# Patient Record
Sex: Male | Born: 1990 | Race: White | Hispanic: No | Marital: Single | Smoking: Never smoker
Health system: Southern US, Community
[De-identification: ages and names within clinical notes are randomized; demographics above are authoritative.]

## PROBLEM LIST (undated history)

## (undated) HISTORY — PX: INNER EAR SURGERY: SHX679

## (undated) HISTORY — PX: RECONSTRUCTION / CORRECTION OF NIPPLE / AEROLA: SUR1073

---
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
## 2012-08-20 ENCOUNTER — Encounter (INDEPENDENT_AMBULATORY_CARE_PROVIDER_SITE_OTHER): Payer: Self-pay | Admitting: General Surgery

## 2012-08-20 ENCOUNTER — Ambulatory Visit (INDEPENDENT_AMBULATORY_CARE_PROVIDER_SITE_OTHER): Payer: 59 | Admitting: General Surgery

## 2012-08-20 VITALS — BP 110/66 | HR 80 | Resp 16 | Ht 69.0 in | Wt 142.6 lb

## 2012-08-20 DIAGNOSIS — K439 Ventral hernia without obstruction or gangrene: Secondary | ICD-10-CM

## 2012-08-20 NOTE — Progress Notes (Signed)
Patient ID: Gary Knapp, male   DOB: [birth year], 22 y.o.   MRN: [id number]  Chief Complaint  Patient presents with  . New Evaluation    abd hernia    HPI Gary Knapp is a 22 y.o. male.  The patient is a 22 year old male who is self-referred for evaluation of the epigastric hernia. Patient states she was small has not gotten bigger is not in any pain. The patient states he can become more active and is looking potentially become a Systems analyst. He does not want this to interfere with his future goals.  HPI  History reviewed. No pertinent past medical history.  Past Surgical History  Procedure Laterality Date  . Reconstruction / correction of nipple / aerola    . Inner ear surgery      History reviewed. No pertinent family history.  Social History History  Substance Use Topics  . Smoking status: Never Smoker   . Smokeless tobacco: Never Used  . Alcohol Use: No    No Known Allergies  No current outpatient prescriptions on file.   No current facility-administered medications for this visit.    Review of Systems Review of Systems  Constitutional: Negative.   HENT: Negative.   Respiratory: Negative.   Cardiovascular: Negative.   Gastrointestinal: Negative.   Neurological: Negative.   All other systems reviewed and are negative.    Blood pressure 110/66, pulse 80, resp. rate 16, height 5\' 9"  (1.753 m), weight 142 lb 9.6 oz (64.683 kg).  Physical Exam Physical Exam  Constitutional: He is oriented to person, place, and time. He appears well-developed and well-nourished.  HENT:  Head: Normocephalic and atraumatic.  Eyes: Conjunctivae and EOM are normal. Pupils are equal, round, and reactive to light.  Neck: Normal range of motion. Neck supple.  Cardiovascular: Normal rate, regular rhythm and normal heart sounds.   Pulmonary/Chest: Effort normal and breath sounds normal.  Abdominal: Soft. Bowel sounds are normal. A hernia is present. Hernia confirmed  positive in the ventral area.    Musculoskeletal: Normal range of motion.  Neurological: He is alert and oriented to person, place, and time.    Data Reviewed none  Assessment    22 year old male with a likely pre-peritoneal epigastric ventral hernia.     Plan     1. We'll proceed to the operating room for open primary repairof a ventral hernia. 2. All risks and benefits were discussed with the patient, to generally include infection, bleeding, damage to surrounding structures, acute and chronic nerve pain, and recurrence. Alternatives were offered and described.  All questions were answered and the patient voiced understanding of the procedure and wishes to proceed at this point.         Marigene Ehlers., Gary Knapp 08/20/2012, 3:20 PM

## 2012-09-16 ENCOUNTER — Telehealth (INDEPENDENT_AMBULATORY_CARE_PROVIDER_SITE_OTHER): Payer: Self-pay | Admitting: General Surgery

## 2012-09-16 NOTE — Telephone Encounter (Signed)
Pt mother cancelled sx for tomorrow

## 2012-10-02 ENCOUNTER — Encounter (INDEPENDENT_AMBULATORY_CARE_PROVIDER_SITE_OTHER): Payer: 59 | Admitting: General Surgery

## 2016-05-24 ENCOUNTER — Emergency Department (HOSPITAL_BASED_OUTPATIENT_CLINIC_OR_DEPARTMENT_OTHER)
Admission: EM | Admit: 2016-05-24 | Discharge: 2016-05-24 | Disposition: A | Discharge status: Home / Self Care | Payer: Self-pay | Attending: Emergency Medicine | Admitting: Emergency Medicine

## 2016-05-24 ENCOUNTER — Encounter (HOSPITAL_BASED_OUTPATIENT_CLINIC_OR_DEPARTMENT_OTHER): Payer: Self-pay | Admitting: Emergency Medicine

## 2016-05-24 DIAGNOSIS — H6121 Impacted cerumen, right ear: Secondary | ICD-10-CM | POA: Insufficient documentation

## 2016-05-24 DIAGNOSIS — H6122 Impacted cerumen, left ear: Secondary | ICD-10-CM

## 2016-05-24 MED ORDER — DOCUSATE SODIUM 100 MG PO CAPS
100.0000 mg | ORAL_CAPSULE | Freq: Once | ORAL | Status: AC
  Administered 2016-05-24: 100 mg via ORAL
  Filled 2016-05-24: qty 1

## 2016-05-24 NOTE — ED Provider Notes (Signed)
[facility]   CSN: [id number] Arrival date & time: 05/24/16  1716  By signing my name below, I, Cynda AcresHailei Fulton, attest that this documentation has been prepared under the direction and in the presence of [place] Russo, PA-C. Electronically Signed: Cynda AcresHailei Fulton, Scribe. 05/24/16. 8:28 PM.  History   Chief Complaint Chief Complaint  Patient presents with  . Ear Fullness   HPI Comments: Gary Knapp is a 26 y.o. male with no pertinent past medical history, who presents to the Emergency Department complaining of sudden-onset, constant right ear ringing that began one week ago. 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 Patient states he removed his ear protection after work and noticed the ringing and decreased hearing in Right ear. Patient reports having left ear surgery in the past. Patient reports an associated unsteady gait. Patient reports soaking his ear with peroxide with no relief. Patient denies any fever, chills, nausea, vomiting, or ear pain.   The history is provided by the patient. No language interpreter was used.    History reviewed. No pertinent past medical history.  There are no active problems to display for this patient.   Past Surgical History:  Procedure Laterality Date  . INNER EAR SURGERY    . RECONSTRUCTION / CORRECTION OF NIPPLE / AEROLA         Home Medications    Prior to Admission medications   Not on File    Family History History reviewed. No pertinent family history.  Social History Social History  Substance Use Topics  . Smoking status: Never Smoker  . Smokeless tobacco: Never Used  . Alcohol use No     Allergies   Patient has no known allergies.   Review of Systems Review of Systems  Constitutional: Negative for chills and fever.  HENT: Negative for ear pain.        Hearing loss and right ear ringing.   Gastrointestinal: Negative for nausea and vomiting.    Neurological:       Balance  problem     Physical Exam Updated Vital Signs BP (!) 154/89 (BP Location: Left Arm)   Pulse 97   Temp 98.5 F (36.9 C) (Oral)   Resp 20   Ht 5\' 9"  (1.753 m)   Wt 175 lb (79.4 kg)   SpO2 100%   BMI 25.84 kg/m   Physical Exam  Constitutional: He appears well-developed and well-nourished.  HENT:  Head: Normocephalic and atraumatic.  Right Ear: External ear normal.  Left Ear: External ear and ear canal normal.  Mouth/Throat: Uvula is midline, oropharynx is clear and moist and mucous membranes are normal.  Cerumen impaction right ear. Left ear TM is not present and ossicles are visible with black suture material.  Eyes: Conjunctivae are normal.  Pulmonary/Chest: Effort normal.  Psychiatric: He has a normal mood and affect. His behavior is normal.  Nursing note and vitals reviewed.    ED Treatments / Results  DIAGNOSTIC STUDIES: Oxygen Saturation is 100% on RA, normal by my interpretation.    COORDINATION OF CARE: 8:26 PM Discussed treatment plan with pt at bedside and pt agreed to plan, which includes cerumen removal.   Labs (all labs ordered are listed, but only abnormal results are displayed) Labs Reviewed - No data to display  EKG  EKG Interpretation None       Radiology No results found.  Procedures Procedures (including critical care time)  Medications Ordered  in ED Medications  docusate sodium (COLACE) capsule 100 mg (100 mg Oral Given 05/24/16 2103)     Initial Impression / Assessment and Plan / ED Course  I have reviewed the triage vital signs and the nursing notes.  Pertinent labs & imaging results that were available during my care of the patient were reviewed by me and considered in my medical decision making (see chart for details).    Patient with cerumen impaction in right ear. Ear was irrigated with Colace, impaction successfully removed without complication. On reevaluation TM visualized and normal.  Patient's symptoms resolved; reports hearing is back to normal in right ear, no longer ringing. Patient to return to ED if he develops hearing loss again, or any other symptoms. Patient afebrile, nontoxic, well-appearing, safe for discharge home.  Patient discussed with Dr. Rush Landmark. Discussed results, findings, treatment and follow up. Patient advised of return precautions. Patient verbalized understanding and agreed with plan.   Final Clinical Impressions(s) / ED Diagnoses   Final diagnoses:  Hearing loss of left ear due to cerumen impaction    New Prescriptions There are no discharge medications for this patient.  I personally performed the services described in this documentation, which was scribed in my presence. The recorded information has been reviewed and is accurate.     Russo, [place] N, PA-C 05/25/16 0211    Tegeler, Canary Brim, MD 05/25/16 [phone]

## 2016-05-24 NOTE — ED Triage Notes (Signed)
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 The pateint reports that last night he lost hearing in his right ear and it is ringing

## 2016-05-24 NOTE — Discharge Instructions (Signed)
Please read instructions below. Return to the ER or follow up with your primary care if you begin having hearing loss, loss of balance, or new ringing in your ear.

## 2016-05-24 NOTE — ED Notes (Signed)
Called to treatment room with no answer from lobby 

## 2016-05-24 NOTE — ED Notes (Signed)
ED Provider at bedside.
